# Patient Record
Sex: Male | Born: 1965 | Race: White | Hispanic: No | Marital: Married | Smoking: Current every day smoker
Health system: Southern US, Community
[De-identification: ages and names within clinical notes are randomized; demographics above are authoritative.]

## PROBLEM LIST (undated history)

## (undated) DIAGNOSIS — E7800 Pure hypercholesterolemia, unspecified: Secondary | ICD-10-CM

## (undated) DIAGNOSIS — I10 Essential (primary) hypertension: Secondary | ICD-10-CM

## (undated) DIAGNOSIS — M549 Dorsalgia, unspecified: Secondary | ICD-10-CM

## (undated) HISTORY — PX: APPENDECTOMY: SHX54

---

## 2014-12-23 ENCOUNTER — Encounter (HOSPITAL_BASED_OUTPATIENT_CLINIC_OR_DEPARTMENT_OTHER): Payer: Self-pay

## 2014-12-23 ENCOUNTER — Emergency Department (HOSPITAL_BASED_OUTPATIENT_CLINIC_OR_DEPARTMENT_OTHER): Payer: Self-pay

## 2014-12-23 ENCOUNTER — Emergency Department (HOSPITAL_BASED_OUTPATIENT_CLINIC_OR_DEPARTMENT_OTHER): Payer: No Typology Code available for payment source

## 2014-12-23 ENCOUNTER — Emergency Department (HOSPITAL_BASED_OUTPATIENT_CLINIC_OR_DEPARTMENT_OTHER)
Admission: EM | Admit: 2014-12-23 | Discharge: 2014-12-23 | Disposition: A | Discharge status: Home / Self Care | Payer: Self-pay | Attending: Emergency Medicine | Admitting: Emergency Medicine

## 2014-12-23 DIAGNOSIS — Y9389 Activity, other specified: Secondary | ICD-10-CM | POA: Insufficient documentation

## 2014-12-23 DIAGNOSIS — S40022A Contusion of left upper arm, initial encounter: Secondary | ICD-10-CM

## 2014-12-23 DIAGNOSIS — S40012A Contusion of left shoulder, initial encounter: Secondary | ICD-10-CM | POA: Insufficient documentation

## 2014-12-23 DIAGNOSIS — E780 Pure hypercholesterolemia: Secondary | ICD-10-CM | POA: Insufficient documentation

## 2014-12-23 DIAGNOSIS — Y998 Other external cause status: Secondary | ICD-10-CM | POA: Insufficient documentation

## 2014-12-23 DIAGNOSIS — I10 Essential (primary) hypertension: Secondary | ICD-10-CM | POA: Insufficient documentation

## 2014-12-23 DIAGNOSIS — Z79899 Other long term (current) drug therapy: Secondary | ICD-10-CM | POA: Insufficient documentation

## 2014-12-23 DIAGNOSIS — W208XXA Other cause of strike by thrown, projected or falling object, initial encounter: Secondary | ICD-10-CM | POA: Insufficient documentation

## 2014-12-23 DIAGNOSIS — Z720 Tobacco use: Secondary | ICD-10-CM | POA: Insufficient documentation

## 2014-12-23 DIAGNOSIS — Y9289 Other specified places as the place of occurrence of the external cause: Secondary | ICD-10-CM | POA: Insufficient documentation

## 2014-12-23 HISTORY — DX: Essential (primary) hypertension: I10

## 2014-12-23 HISTORY — DX: Dorsalgia, unspecified: M54.9

## 2014-12-23 HISTORY — DX: Pure hypercholesterolemia, unspecified: E78.00

## 2014-12-23 MED ORDER — OXYCODONE-ACETAMINOPHEN 5-325 MG PO TABS
1.0000 | ORAL_TABLET | Freq: Once | ORAL | Status: AC
  Administered 2014-12-23: 1 via ORAL
  Filled 2014-12-23: qty 1

## 2014-12-23 MED ORDER — KETOROLAC TROMETHAMINE 60 MG/2ML IM SOLN
60.0000 mg | Freq: Once | INTRAMUSCULAR | Status: AC
Start: 2014-12-23 — End: 2014-12-23
  Administered 2014-12-23: 60 mg via INTRAMUSCULAR
  Filled 2014-12-23: qty 2

## 2014-12-23 MED ORDER — OXYCODONE-ACETAMINOPHEN 5-325 MG PO TABS: 1.0000 | ORAL_TABLET | Freq: Four times a day (QID) | ORAL | Status: AC | PRN

## 2014-12-23 MED ORDER — IBUPROFEN 600 MG PO TABS: 600.0000 mg | ORAL_TABLET | Freq: Four times a day (QID) | ORAL | Status: AC | PRN

## 2014-12-23 NOTE — Discharge Instructions (Signed)
Contusion °A contusion is a deep bruise. Contusions are the result of an injury that caused bleeding under the skin. The contusion may turn blue, purple, or yellow. Minor injuries will give you a painless contusion, but more severe contusions may stay painful and swollen for a few weeks.  °CAUSES  °A contusion is usually caused by a blow, trauma, or direct force to an area of the body. °SYMPTOMS  °· Swelling and redness of the injured area. °· Bruising of the injured area. °· Tenderness and soreness of the injured area. °· Pain. °DIAGNOSIS  °The diagnosis can be made by taking a history and physical exam. An X-ray, CT scan, or MRI may be needed to determine if there were any associated injuries, such as fractures. °TREATMENT  °Specific treatment will depend on what area of the body was injured. In general, the best treatment for a contusion is resting, icing, elevating, and applying cold compresses to the injured area. Over-the-counter medicines may also be recommended for pain control. Ask your caregiver what the best treatment is for your contusion. °HOME CARE INSTRUCTIONS  °· Put ice on the injured area. °¨ Put ice in a plastic bag. °¨ Place a towel between your skin and the bag. °¨ Leave the ice on for 15-20 minutes, 3-4 times a day, or as directed by your health care provider. °· Only take over-the-counter or prescription medicines for pain, discomfort, or fever as directed by your caregiver. Your caregiver may recommend avoiding anti-inflammatory medicines (aspirin, ibuprofen, and naproxen) for 48 hours because these medicines may increase bruising. °· Rest the injured area. °· If possible, elevate the injured area to reduce swelling. °SEEK IMMEDIATE MEDICAL CARE IF:  °· You have increased bruising or swelling. °· You have pain that is getting worse. °· Your swelling or pain is not relieved with medicines. °MAKE SURE YOU:  °· Understand these instructions. °· Will watch your condition. °· Will get help right  away if you are not doing well or get worse. °Document Released: 05/18/2005 Document Revised: 08/13/2013 Document Reviewed: 06/13/2011 °ExitCare® Patient Information ©2015 [employer]. This information is not intended to replace advice given to you by your health care provider. Make sure you discuss any questions you have with your health care provider. ° °

## 2014-12-23 NOTE — ED Provider Notes (Signed)
CSN: [id number]     Arrival date & time 12/23/14  2126 History  This chart was scribed for Shon Baton, MD by Abel Presto, ED Scribe. This patient was seen in room MH12/MH12 and the patient's care was started at 11:12 PM.    Chief Complaint  Patient presents with  . Arm Injury     Patient is a 49 y.o. male presenting with arm injury. The history is provided by the patient. No language interpreter was used.  Arm Injury  HPI Comments: Cameron Lopez is a 49 y.o. male with PMHx of HTN, HLD, and back pain who presents to the Emergency Department complaining of constant left shoulder pain around 8:40 PM. Pt states he was at [employer] at onset when a 50lb bucket of joint compound fell onto his arm. Pt notes some associated numbness in arm. Patient is right-handed. Reports 10 out of 10 pain. Noted bruising to the area of impact. Pt takes Tramadol and Amlodipine daily. Pt denies weakness, head injury, and LOC.  Past Medical History  Diagnosis Date  . Hypertension   . Back pain   . High cholesterol    Past Surgical History  Procedure Laterality Date  . Appendectomy     No family history on file. History  Substance Use Topics  . Smoking status: Current Every Day Smoker  . Smokeless tobacco: Not on file  . Alcohol Use: No    Review of Systems  Musculoskeletal:       Arm pain  Skin: Positive for wound.  Neurological: Positive for numbness. Negative for weakness.  All other systems reviewed and are negative.     Allergies  Erythromycin  Home Medications   Prior to Admission medications   Medication Sig Start Date End Date Taking? Authorizing Provider  AMLODIPINE BESYLATE PO Take by mouth.   Yes Historical Provider, MD  ATORVASTATIN CALCIUM PO Take by mouth.   Yes Historical Provider, MD  Celecoxib (CELEBREX PO) Take by mouth.   Yes Historical Provider, MD  ibuprofen (ADVIL,MOTRIN) 600 MG tablet Take 1 tablet (600 mg total) by mouth every 6 (six) hours as needed. 12/23/14    Shon Baton, MD  omega-3 acid ethyl esters (LOVAZA) 1 G capsule Take by mouth 2 (two) times daily.   Yes Historical Provider, MD  oxyCODONE-acetaminophen (PERCOCET/ROXICET) 5-325 MG per tablet Take 1 tablet by mouth every 6 (six) hours as needed for severe pain. 12/23/14   Shon Baton, MD  TRAMADOL HCL PO Take by mouth.   Yes Historical Provider, MD   BP 161/100 mmHg  Pulse 78  Temp(Src) 98.3 F (36.8 C) (Oral)  Resp 18  Ht 6' (1.829 m)  Wt 165 lb (74.844 kg)  BMI 22.37 kg/m2  SpO2 95% Physical Exam  Constitutional: He is oriented to person, place, and time. He appears well-developed and well-nourished. No distress.  HENT:  Head: Normocephalic and atraumatic.  Cardiovascular: Normal rate and regular rhythm.   Pulmonary/Chest: Effort normal. No respiratory distress.  Abdominal: Soft. There is no tenderness.  Musculoskeletal:  Normal range of motion of the left shoulder and elbow, contusion noted over the mid humerus, tenderness to palpation, no obvious deformity, 2+ radial pulse, good Strength distally  Neurological: He is alert and oriented to person, place, and time.  Skin: Skin is warm and dry.  Psychiatric: He has a normal mood and affect.  Nursing note and vitals reviewed.   ED Course  Procedures (including critical care time) DIAGNOSTIC STUDIES: Oxygen Saturation is  97% on room air, normal by my interpretation.    COORDINATION OF CARE: 11:16 PM Discussed treatment plan with patient at beside, the patient agrees with the plan and has no further questions at this time.   Labs Review Labs Reviewed - No data to display  Imaging Review Dg Humerus Left  12/23/2014   CLINICAL DATA:  Struck in LEFT arm by a 5 gal bucket that fell from a shell above this head at [employer], lateral pain and bruising  EXAM: LEFT HUMERUS - 2+ VIEW  COMPARISON:  None  FINDINGS: Osseous mineralization normal.  AC joint alignment normal.  No acute fracture, dislocation or bone destruction.   IMPRESSION: Normal exam.   Electronically Signed   By: Ulyses SouthwardMark  Boles M.D.   On: 12/23/2014 21:57     EKG Interpretation None      MDM   Final diagnoses:  Arm contusion, left, initial encounter   Patient presents with right arm pain after sustaining injury. No obvious deformity. Obvious contusion. Neurovascular exam intact. Imaging negative. Patient given Toradol and will be discharged with a short course of pain medication. Discussed with patient rest, ice, compression, and supportive care at home. Patient stated understanding.  After history, exam, and medical workup I feel the patient has been appropriately medically screened and is safe for discharge home. Pertinent diagnoses were discussed with the patient. Patient was given return precautions.   I personally performed the services described in this documentation, which was scribed in my presence. The recorded information has been reviewed and is accurate.     Shon Batonourtney F Horton, MD 12/23/14 [phone]

## 2014-12-23 NOTE — ED Notes (Addendum)
Pt reports a 50 lb bucket of joint compound fell onto his left upper arm this pm-slight abrasion noted

## 2014-12-28 ENCOUNTER — Encounter (HOSPITAL_BASED_OUTPATIENT_CLINIC_OR_DEPARTMENT_OTHER): Payer: Self-pay | Admitting: *Deleted

## 2014-12-28 ENCOUNTER — Emergency Department (HOSPITAL_BASED_OUTPATIENT_CLINIC_OR_DEPARTMENT_OTHER)
Admission: EM | Admit: 2014-12-28 | Discharge: 2014-12-28 | Disposition: A | Discharge status: Home / Self Care | Payer: Self-pay | Attending: Emergency Medicine | Admitting: Emergency Medicine

## 2014-12-28 DIAGNOSIS — I10 Essential (primary) hypertension: Secondary | ICD-10-CM | POA: Insufficient documentation

## 2014-12-28 DIAGNOSIS — W208XXA Other cause of strike by thrown, projected or falling object, initial encounter: Secondary | ICD-10-CM | POA: Insufficient documentation

## 2014-12-28 DIAGNOSIS — Y990 Civilian activity done for income or pay: Secondary | ICD-10-CM | POA: Insufficient documentation

## 2014-12-28 DIAGNOSIS — Y9389 Activity, other specified: Secondary | ICD-10-CM | POA: Insufficient documentation

## 2014-12-28 DIAGNOSIS — Z720 Tobacco use: Secondary | ICD-10-CM | POA: Insufficient documentation

## 2014-12-28 DIAGNOSIS — M542 Cervicalgia: Secondary | ICD-10-CM

## 2014-12-28 DIAGNOSIS — M79602 Pain in left arm: Secondary | ICD-10-CM

## 2014-12-28 DIAGNOSIS — S4992XA Unspecified injury of left shoulder and upper arm, initial encounter: Secondary | ICD-10-CM | POA: Insufficient documentation

## 2014-12-28 DIAGNOSIS — Z8639 Personal history of other endocrine, nutritional and metabolic disease: Secondary | ICD-10-CM | POA: Insufficient documentation

## 2014-12-28 DIAGNOSIS — Z79899 Other long term (current) drug therapy: Secondary | ICD-10-CM | POA: Insufficient documentation

## 2014-12-28 DIAGNOSIS — S199XXA Unspecified injury of neck, initial encounter: Secondary | ICD-10-CM | POA: Insufficient documentation

## 2014-12-28 DIAGNOSIS — Z8739 Personal history of other diseases of the musculoskeletal system and connective tissue: Secondary | ICD-10-CM | POA: Insufficient documentation

## 2014-12-28 DIAGNOSIS — Y9289 Other specified places as the place of occurrence of the external cause: Secondary | ICD-10-CM | POA: Insufficient documentation

## 2014-12-28 NOTE — ED Provider Notes (Signed)
CSN: [id number]     Arrival date & time 12/28/14  1102 History   First MD Initiated Contact with Patient 12/28/14 1205     Chief Complaint  Patient presents with  . Arm Pain     (Consider location/radiation/quality/duration/timing/severity/associated sxs/prior Treatment) HPI  Cameron Lopez is a 49 y.o. male with PMH of HTN, dyslipidemia, back pain presenting with left arm injury 6 days ago at work where 5 gallon bucket fell on arm. Pt with negative xrays and given ibuprofen and percocet with improvement of his symptoms. Pt states the pain is getting better, worse with movement. Pt states he has developed new left sided neck pain and shoulderblade pain. No injury. No redness, swelling, numbness, tingling, fevers, chills.   Past Medical History  Diagnosis Date  . Hypertension   . Back pain   . High cholesterol   . Back pain    Past Surgical History  Procedure Laterality Date  . Appendectomy     No family history on file. History  Substance Use Topics  . Smoking status: Current Every Day Smoker    Types: Cigarettes  . Smokeless tobacco: Never Used  . Alcohol Use: No    Review of Systems See above   Allergies  Erythromycin  Home Medications   Prior to Admission medications   Medication Sig Start Date End Date Taking? Authorizing Provider  AMLODIPINE BESYLATE PO Take by mouth.   Yes Historical Provider, MD  ATORVASTATIN CALCIUM PO Take by mouth.   Yes Historical Provider, MD  Celecoxib (CELEBREX PO) Take by mouth.   Yes Historical Provider, MD  ibuprofen (ADVIL,MOTRIN) 600 MG tablet Take 1 tablet (600 mg total) by mouth every 6 (six) hours as needed. 12/23/14  Yes Shon Batonourtney F Horton, MD  omega-3 acid ethyl esters (LOVAZA) 1 G capsule Take by mouth 2 (two) times daily.   Yes Historical Provider, MD  oxyCODONE-acetaminophen (PERCOCET/ROXICET) 5-325 MG per tablet Take 1 tablet by mouth every 6 (six) hours as needed for severe pain. 12/23/14  Yes Shon Batonourtney F Horton, MD  TRAMADOL  HCL PO Take by mouth.   Yes Historical Provider, MD   BP 140/91 mmHg  Pulse 73  Temp(Src) 98.4 F (36.9 C) (Oral)  Resp 18  Ht 6' (1.829 m)  Wt 165 lb (74.844 kg)  BMI 22.37 kg/m2  SpO2 97% Physical Exam  Constitutional: He appears well-developed and well-nourished. No distress.  HENT:  Head: Normocephalic and atraumatic.  Eyes: Conjunctivae are normal. Right eye exhibits no discharge. Left eye exhibits no discharge.  Cardiovascular:  2+ radial pulses equal bilaterally  Pulmonary/Chest: Effort normal and breath sounds normal. No respiratory distress. He has no wheezes.  Left upper back tenderness to scapula without overlying skin changes, subq air or flail chest  Musculoskeletal:  FROM of left shoulder with strength intact. Ecchymoses in expected stages of healing. No signficant tenderness to palpation. Hypertrophy of left trapezius muscle. FROM of neck. Strength and sensation intact.  Neurological: He is alert. Coordination normal.  Skin: He is not diaphoretic.  Psychiatric: He has a normal mood and affect. His behavior is normal.  Nursing note and vitals reviewed.   ED Course  Procedures (including critical care time) Labs Review Labs Reviewed - No data to display  Imaging Review No results found.   EKG Interpretation None      MDM   Final diagnoses:  Neck pain on left side  Pain of left upper extremity   Pt presenting with new neck and shoulder blade  pain after arm injury. Likely from using different muscle to enable arm injury to heal. No indication for x rays. Neurovascularly intact. RICE and ibuprofen use discussed. Referral to sports medicine for persistent symptoms. Pt well appearing and stable for discharge.     Oswaldo ConroyVictoria Yanky Vanderburg, PA-C 12/28/14 1237  Benjiman CoreNathan Pickering, MD 12/28/14 1435

## 2014-12-28 NOTE — ED Notes (Signed)
Pt reports 5/3 had injury to L upper arm while at work, 5 gallon bucket fell onto arm, had xray which was negative, given ibuprofen and percocet and states he only took a few times.  Reports pain improved only minimally, worse with extension of arm laterally, full sensation and rom.  Pain radiating to trapezius and l shoulderblade area.  No other injuries.

## 2014-12-28 NOTE — ED Notes (Signed)
PA at bedside.

## 2014-12-28 NOTE — Discharge Instructions (Signed)
Return to the emergency room with worsening of symptoms, new symptoms or with symptoms that are concerning, especially numbness, tingling, redness, swelling, unable to move arm. RICE: Rest, Ice (three cycles of 20 mins on, 20mins off at least twice a day), compression/brace, elevation. Heating pad works well for neck pain. Ibuprofen 400mg  (2 tablets 200mg ) every 5-6 hours for 3-5 days. Follow up with PCP/orthopedist if symptoms worsen or are persistent. Read below information and follow recommendations.  Cervical Sprain A cervical sprain is an injury in the neck in which the strong, fibrous tissues (ligaments) that connect your neck bones stretch or tear. Cervical sprains can range from mild to severe. Severe cervical sprains can cause the neck vertebrae to be unstable. This can lead to damage of the spinal cord and can result in serious nervous system problems. The amount of time it takes for a cervical sprain to get better depends on the cause and extent of the injury. Most cervical sprains heal in 1 to 3 weeks. CAUSES  Severe cervical sprains may be caused by:   Contact sport injuries (such as from football, rugby, wrestling, hockey, auto racing, gymnastics, diving, martial arts, or boxing).   Motor vehicle collisions.   Whiplash injuries. This is an injury from a sudden forward and backward whipping movement of the head and neck.  Falls.  Mild cervical sprains may be caused by:   Being in an awkward position, such as while cradling a telephone between your ear and shoulder.   Sitting in a chair that does not offer proper support.   Working at a poorly Marketing executivedesigned computer station.   Looking up or down for long periods of time.  SYMPTOMS   Pain, soreness, stiffness, or a burning sensation in the front, back, or sides of the neck. This discomfort may develop immediately after the injury or slowly, 24 hours or more after the injury.   Pain or tenderness directly in the middle of  the back of the neck.   Shoulder or upper back pain.   Limited ability to move the neck.   Headache.   Dizziness.   Weakness, numbness, or tingling in the hands or arms.   Muscle spasms.   Difficulty swallowing or chewing.   Tenderness and swelling of the neck.  DIAGNOSIS  Most of the time your health care provider can diagnose a cervical sprain by taking your history and doing a physical exam. Your health care provider will ask about previous neck injuries and any known neck problems, such as arthritis in the neck. X-rays may be taken to find out if there are any other problems, such as with the bones of the neck. Other tests, such as a CT scan or MRI, may also be needed.  TREATMENT  Treatment depends on the severity of the cervical sprain. Mild sprains can be treated with rest, keeping the neck in place (immobilization), and pain medicines. Severe cervical sprains are immediately immobilized. Further treatment is done to help with pain, muscle spasms, and other symptoms and may include:  Medicines, such as pain relievers, numbing medicines, or muscle relaxants.   Physical therapy. This may involve stretching exercises, strengthening exercises, and posture training. Exercises and improved posture can help stabilize the neck, strengthen muscles, and help stop symptoms from returning.  HOME CARE INSTRUCTIONS   Put ice on the injured area.   Put ice in a plastic bag.   Place a towel between your skin and the bag.   Leave the ice on for  15-20 minutes, 3-4 times a day.   If your injury was severe, you may have been given a cervical collar to wear. A cervical collar is a two-piece collar designed to keep your neck from moving while it heals.  Do not remove the collar unless instructed by your health care provider.  If you have long hair, keep it outside of the collar.  Ask your health care provider before making any adjustments to your collar. Minor adjustments may  be required over time to improve comfort and reduce pressure on your chin or on the back of your head.  Ifyou are allowed to remove the collar for cleaning or bathing, follow your health care provider's instructions on how to do so safely.  Keep your collar clean by wiping it with mild soap and water and drying it completely. If the collar you have been given includes removable pads, remove them every 1-2 days and hand wash them with soap and water. Allow them to air dry. They should be completely dry before you wear them in the collar.  If you are allowed to remove the collar for cleaning and bathing, wash and dry the skin of your neck. Check your skin for irritation or sores. If you see any, tell your health care provider.  Do not drive while wearing the collar.   Only take over-the-counter or prescription medicines for pain, discomfort, or fever as directed by your health care provider.   Keep all follow-up appointments as directed by your health care provider.   Keep all physical therapy appointments as directed by your health care provider.   Make any needed adjustments to your workstation to promote good posture.   Avoid positions and activities that make your symptoms worse.   Warm up and stretch before being active to help prevent problems.  SEEK MEDICAL CARE IF:   Your pain is not controlled with medicine.   You are unable to decrease your pain medicine over time as planned.   Your activity level is not improving as expected.  SEEK IMMEDIATE MEDICAL CARE IF:   You develop any bleeding.  You develop stomach upset.  You have signs of an allergic reaction to your medicine.   Your symptoms get worse.   You develop new, unexplained symptoms.   You have numbness, tingling, weakness, or paralysis in any part of your body.  MAKE SURE YOU:   Understand these instructions.  Will watch your condition.  Will get help right away if you are not doing well or  get worse. Document Released: 06/05/2007 Document Revised: 08/13/2013 Document Reviewed: 02/13/2013 [facility] Patient Information 2015 [employer], [place]. This information is not intended to replace advice given to you by your health care provider. Make sure you discuss any questions you have with your health care provider.

## 2014-12-28 NOTE — ED Notes (Signed)
Recheck left arm s/p arm injury at [employer] last Tuesday- states still having pain

## 2014-12-31 ENCOUNTER — Ambulatory Visit (INDEPENDENT_AMBULATORY_CARE_PROVIDER_SITE_OTHER): Payer: Self-pay | Admitting: Family Medicine

## 2014-12-31 ENCOUNTER — Encounter: Payer: Self-pay | Admitting: Family Medicine

## 2014-12-31 VITALS — BP 152/95 | HR 75 | Ht 72.0 in | Wt 165.0 lb

## 2014-12-31 DIAGNOSIS — S4992XA Unspecified injury of left shoulder and upper arm, initial encounter: Secondary | ICD-10-CM

## 2014-12-31 NOTE — Patient Instructions (Signed)
You have a biceps contusion with nerve irritation of your radial nerve at the upper arm. I would expect these to completely resolve over the next 2-4 weeks. Follow up with me in that time frame. Start home range of motion exercises (arm circles, pendulums, wall walking or table slides) - 3 sets of 10 once or twice a day. Call me if you want to do physical therapy. I wouldn't do ice or heat actually for this - ice can slow nerve issues from recovering, heat may make your pain worse. Tylenol, ibuprofen only as needed for pain.

## 2015-01-06 DIAGNOSIS — S4992XA Unspecified injury of left shoulder and upper arm, initial encounter: Secondary | ICD-10-CM | POA: Insufficient documentation

## 2015-01-06 NOTE — Assessment & Plan Note (Signed)
2/2 direct blow from 5 gallon pail of joint compounds.  Consistent with biceps muscle contusion - swelling likely to be causing local nerve irritation of the radial nerve in the humerus leading to numbness into digits.  Start home exercises for the shoulder (codman).  Tylenol, ibuprofen if needed.  Call us if he wants to do PT.  F/u in 2-4 weeks otherwise - expect this to completely resolve in that time frame.

## 2015-01-06 NOTE — Progress Notes (Signed)
PCP: No primary care provider on file.  Subjective:   HPI: Patient is a 49 y.o. male here for left arm injury.  [facility] a 5 gallon pail of joint compound feel off a high shelf and landed on his left upper arm. Immediate pain lateral left upper arm. Could move fully initially though next day could only abduct to about 45 degrees - has improved to about 100. Yesterday developed pain near elbow with tingling into all fingers of left hand. No prior issues with this shoulder or arm. Is right handed. Radiographs of humerus negative.  Past Medical History  Diagnosis Date  . Hypertension   . Back pain   . High cholesterol   . Back pain     Current Outpatient Prescriptions on File Prior to Visit  Medication Sig Dispense Refill  . AMLODIPINE BESYLATE PO Take by mouth.    . ATORVASTATIN CALCIUM PO Take by mouth.    . Celecoxib (CELEBREX PO) Take by mouth.    Marland Kitchen. ibuprofen (ADVIL,MOTRIN) 600 MG tablet Take 1 tablet (600 mg total) by mouth every 6 (six) hours as needed. 30 tablet 0  . omega-3 acid ethyl esters (LOVAZA) 1 G capsule Take by mouth 2 (two) times daily.    Marland Kitchen. oxyCODONE-acetaminophen (PERCOCET/ROXICET) 5-325 MG per tablet Take 1 tablet by mouth every 6 (six) hours as needed for severe pain. 10 tablet 0  . TRAMADOL HCL PO Take by mouth.     No current facility-administered medications on file prior to visit.    Past Surgical History  Procedure Laterality Date  . Appendectomy      Allergies  Allergen Reactions  . Erythromycin Itching    History   Social History  . Marital Status: Married    Spouse Name: N/A  . Number of Children: N/A  . Years of Education: N/A   Occupational History  . Not on file.   Social History Main Topics  . Smoking status: Current Every Day Smoker -- 1.00 packs/day    Types: Cigarettes  . Smokeless tobacco: Never Used  . Alcohol Use: No  . Drug Use: No  . Sexual Activity: Not on file   Other Topics  Concern  . Not on file   Social History Narrative    No family history on file.  BP 152/95 mmHg  Pulse 75  Ht 6' (1.829 m)  Wt 165 lb (74.844 kg)  BMI 22.37 kg/m2  Review of Systems: See HPI above.    Objective:  Physical Exam:  Gen: NAD  Left shoulder: No swelling, ecchymoses.  No gross deformity. No TTP. FROM passively but pain on abduction beyond 100 degrees actively. Negative Hawkins, Neers. Mild pain with speeds, yergasons, elbow flexion. Strength 5/5 with empty can and resisted internal/external rotation. Negative apprehension. Sensation slightly diminished all digits dorsally compared to right hand.    Assessment & Plan:  1. Left arm injury - 2/2 direct blow from 5 gallon pail of joint compounds.  Consistent with biceps muscle contusion - swelling likely to be causing local nerve irritation of the radial nerve in the humerus leading to numbness into digits.  Start home exercises for the shoulder (codman).  Tylenol, ibuprofen if needed.  Call us if he wants to do PT.  F/u in 2-4 weeks otherwise - expect this to completely resolve in that time frame.

## 2015-01-15 ENCOUNTER — Encounter: Payer: Self-pay | Admitting: Family Medicine

## 2015-01-15 ENCOUNTER — Ambulatory Visit (INDEPENDENT_AMBULATORY_CARE_PROVIDER_SITE_OTHER): Payer: Self-pay | Admitting: Family Medicine

## 2015-01-15 VITALS — BP 135/90 | HR 76 | Ht 72.0 in | Wt 165.0 lb

## 2015-01-15 DIAGNOSIS — S4992XD Unspecified injury of left shoulder and upper arm, subsequent encounter: Secondary | ICD-10-CM

## 2015-01-16 NOTE — Assessment & Plan Note (Signed)
2/2 direct blow from 5 gallon pail of joint compounds.  Consistent with biceps muscle contusion which has almost completely resolved.  Still with some nerve irritation - discussed could take weeks for this to completely recover.  No motor findings now however.  F/u in about 4-6 weeks for reevaluation.

## 2015-01-16 NOTE — Progress Notes (Signed)
PCP: No primary care provider on file.  Subjective:   HPI: Patient is a 49 y.o. male here for left arm injury.  5/11: [facility] when a 5 gallon pail of joint compound feel off a high shelf and landed on his left upper arm. Immediate pain lateral left upper arm. Could move fully initially though next day could only abduct to about 45 degrees - has improved to about 100. Yesterday developed pain near elbow with tingling into all fingers of left hand. No prior issues with this shoulder or arm. Is right handed. Radiographs of humerus negative.  5/26: Patient reports he is much better. Pain is gone. Still has numbness in index and middle fingers of left hand. Motion better of shoulder.  Past Medical History  Diagnosis Date  . Hypertension   . Back pain   . High cholesterol   . Back pain     Current Outpatient Prescriptions on File Prior to Visit  Medication Sig Dispense Refill  . AMLODIPINE BESYLATE PO Take by mouth.    . ATORVASTATIN CALCIUM PO Take by mouth.    . Celecoxib (CELEBREX PO) Take by mouth.    . fluticasone (FLONASE) 50 MCG/ACT nasal spray   11  . ibuprofen (ADVIL,MOTRIN) 600 MG tablet Take 1 tablet (600 mg total) by mouth every 6 (six) hours as needed. 30 tablet 0  . omega-3 acid ethyl esters (LOVAZA) 1 G capsule Take by mouth 2 (two) times daily.    Marland Kitchen. oxyCODONE-acetaminophen (PERCOCET/ROXICET) 5-325 MG per tablet Take 1 tablet by mouth every 6 (six) hours as needed for severe pain. 10 tablet 0  . TRAMADOL HCL PO Take by mouth.     No current facility-administered medications on file prior to visit.    Past Surgical History  Procedure Laterality Date  . Appendectomy      Allergies  Allergen Reactions  . Erythromycin Itching    History   Social History  . Marital Status: Married    Spouse Name: N/A  . Number of Children: N/A  . Years of Education: N/A   Occupational History  . Not on file.   Social History Main  Topics  . Smoking status: Current Every Day Smoker -- 1.00 packs/day    Types: Cigarettes  . Smokeless tobacco: Never Used  . Alcohol Use: No  . Drug Use: No  . Sexual Activity: Not on file   Other Topics Concern  . Not on file   Social History Narrative    No family history on file.  BP 135/90 mmHg  Pulse 76  Ht 6' (1.829 m)  Wt 165 lb (74.844 kg)  BMI 22.37 kg/m2  Review of Systems: See HPI above.    Objective:  Physical Exam:  Gen: NAD  Left shoulder: No swelling, ecchymoses.  No gross deformity. No TTP. FROM with minimal painful arc. Negative Hawkins, Neers. Negative yergasons.  No pain with elbow motions. Strength 5/5 with empty can and resisted internal/external rotation. Sensation diminished 2nd and 3rd digits from middle phalanx distally.    Assessment & Plan:  1. Left arm injury - 2/2 direct blow from 5 gallon pail of joint compounds.  Consistent with biceps muscle contusion which has almost completely resolved.  Still with some nerve irritation - discussed could take weeks for this to completely recover.  No motor findings now however.  F/u in about 4-6 weeks for reevaluation.

## 2015-02-11 ENCOUNTER — Ambulatory Visit (INDEPENDENT_AMBULATORY_CARE_PROVIDER_SITE_OTHER): Payer: Self-pay | Admitting: Family Medicine

## 2015-02-11 ENCOUNTER — Encounter: Payer: Self-pay | Admitting: Family Medicine

## 2015-02-11 VITALS — BP 137/79 | HR 79 | Ht 72.0 in | Wt 165.0 lb

## 2015-02-11 DIAGNOSIS — S4992XD Unspecified injury of left shoulder and upper arm, subsequent encounter: Secondary | ICD-10-CM

## 2015-02-12 NOTE — Assessment & Plan Note (Signed)
2/2 direct blow from 5 gallon pail of joint compounds.  Consistent with biceps muscle contusion, nerve irritation - all has resolved.  F/u prn.

## 2015-02-12 NOTE — Progress Notes (Signed)
PCP: No primary care provider on file.  Subjective:   HPI: Patient is a 49 y.o. male here for left arm injury.  5/11: Patient reports he was at [facility] on 5/3 when a 5 gallon pail of joint compound feel off a high shelf and landed on his left upper arm. Immediate pain lateral left upper arm. Could move fully initially though next day could only abduct to about 45 degrees - has improved to about 100. Yesterday developed pain near elbow with tingling into all fingers of left hand. No prior issues with this shoulder or arm. Is right handed. Radiographs of humerus negative.  5/26: Patient reports he is much better. Pain is gone. Still has numbness in index and middle fingers of left hand. Motion better of shoulder.  6/22: Patient reports he feels completely better at this point. No pain. Tingling/numbness in middle and index fingers resolved about a week ago.  Past Medical History  Diagnosis Date  . Hypertension   . Back pain   . High cholesterol   . Back pain     Current Outpatient Prescriptions on File Prior to Visit  Medication Sig Dispense Refill  . fluticasone (FLONASE) 50 MCG/ACT nasal spray   11  . ibuprofen (ADVIL,MOTRIN) 600 MG tablet Take 1 tablet (600 mg total) by mouth every 6 (six) hours as needed. 30 tablet 0  . omega-3 acid ethyl esters (LOVAZA) 1 G capsule Take by mouth 2 (two) times daily.    Marland Kitchen oxyCODONE-acetaminophen (PERCOCET/ROXICET) 5-325 MG per tablet Take 1 tablet by mouth every 6 (six) hours as needed for severe pain. 10 tablet 0  . TRAMADOL HCL PO Take by mouth.     No current facility-administered medications on file prior to visit.    Past Surgical History  Procedure Laterality Date  . Appendectomy      Allergies  Allergen Reactions  . Erythromycin Itching    History   Social History  . Marital Status: Married    Spouse Name: N/A  . Number of Children: N/A  . Years of Education: N/A   Occupational History  . Not on file.    Social History Main Topics  . Smoking status: Current Every Day Smoker -- 1.00 packs/day    Types: Cigarettes  . Smokeless tobacco: Never Used  . Alcohol Use: No  . Drug Use: No  . Sexual Activity: Not on file   Other Topics Concern  . Not on file   Social History Narrative    No family history on file.  BP 137/79 mmHg  Pulse 79  Ht 6' (1.829 m)  Wt 165 lb (74.844 kg)  BMI 22.37 kg/m2  Review of Systems: See HPI above.    Objective:  Physical Exam:  Gen: NAD  Left shoulder: No swelling, ecchymoses.  No gross deformity. No TTP. FROM without painful arc. Negative Hawkins, Neers. Negative yergasons. Strength 5/5 with empty can and resisted internal/external rotation. Sensation intact to light touch in digits.    Assessment & Plan:  1. Left arm injury - 2/2 direct blow from 5 gallon pail of joint compounds.  Consistent with biceps muscle contusion, nerve irritation - all has resolved.  F/u prn.

## 2015-10-19 IMAGING — DX DG HUMERUS 2V *L*
2 series · 2 of 2 positions shown · non-contrast
Comparison: None

CLINICAL DATA: Struck in LEFT arm by a 5 gal bucket that fell from
a shell above this head at [employer], lateral pain and bruising

EXAM:
LEFT HUMERUS - 2+ VIEW

[humerus ap]
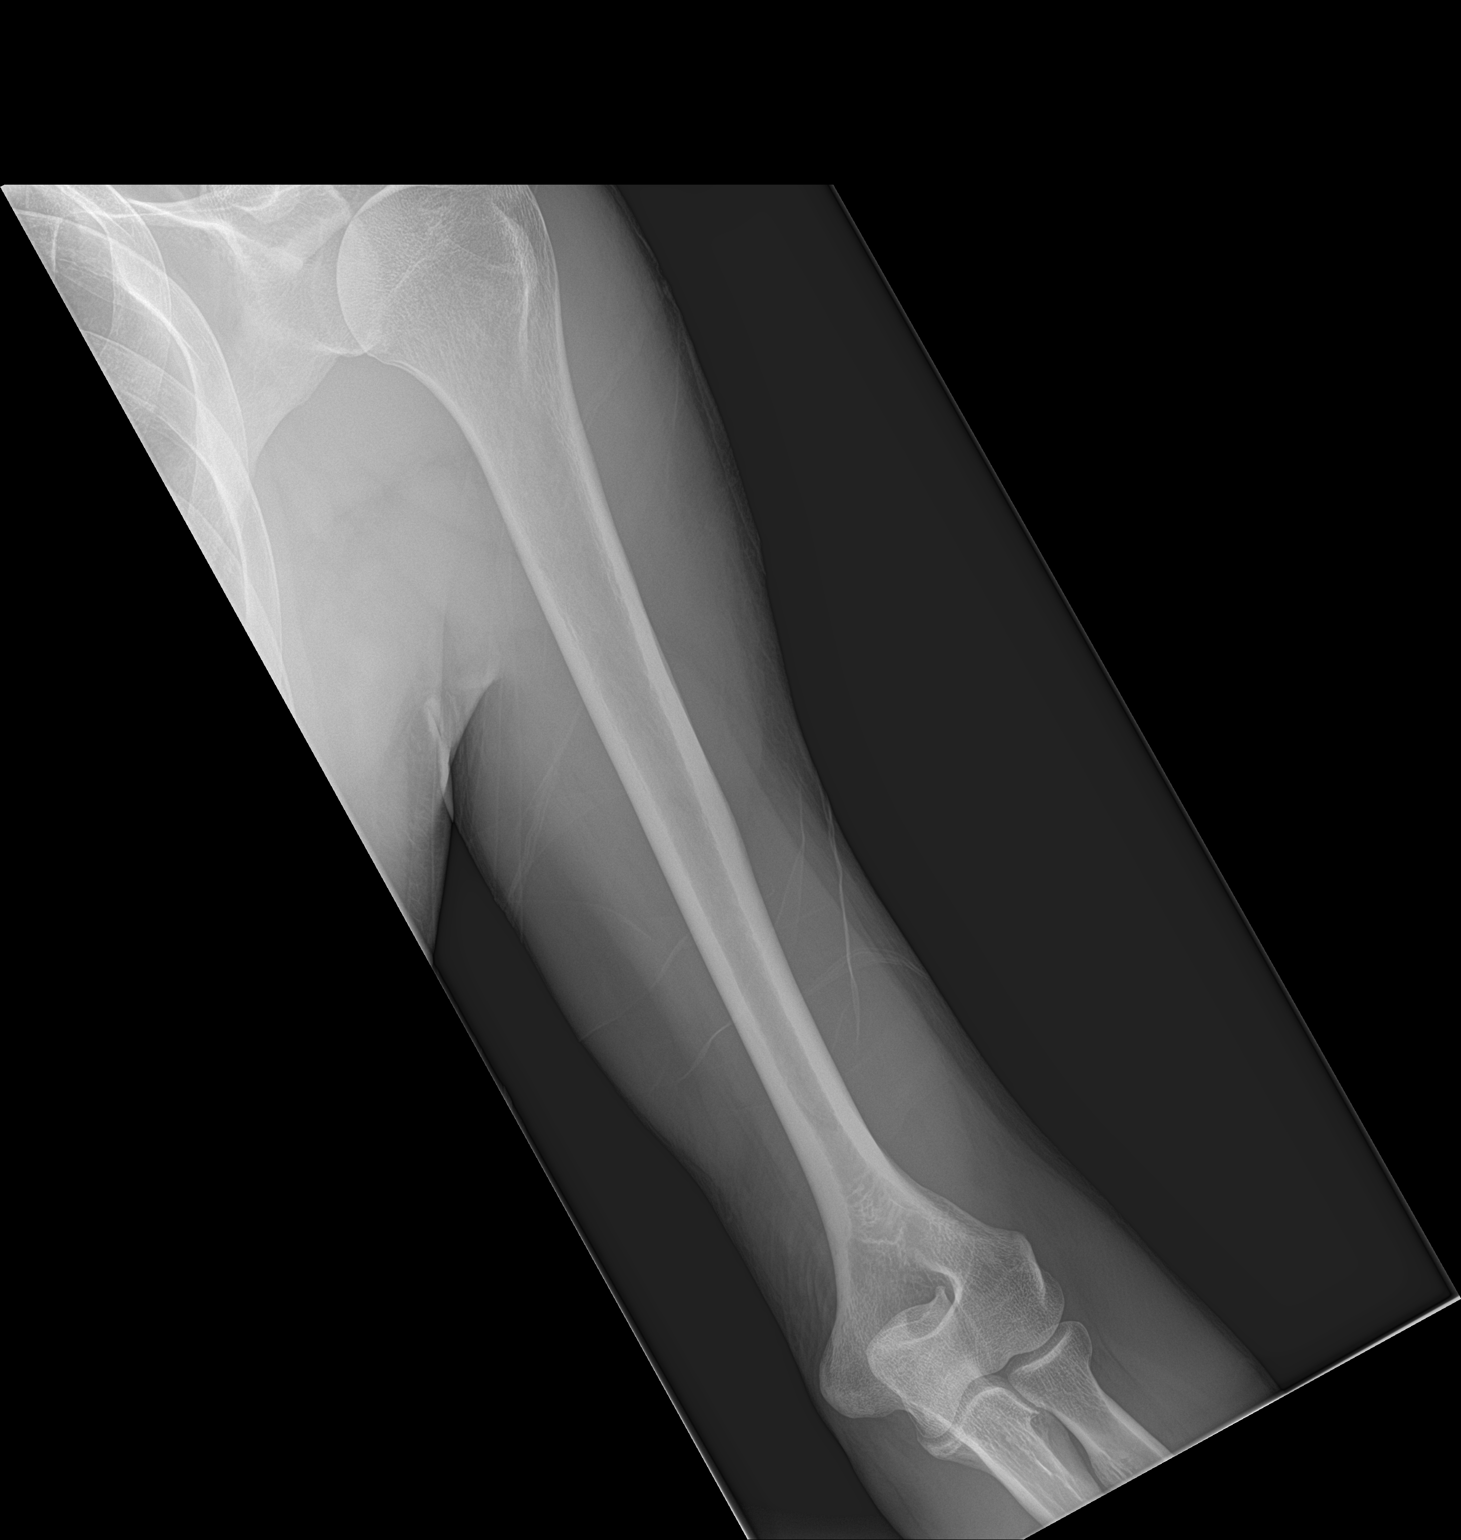

[humerus lat]
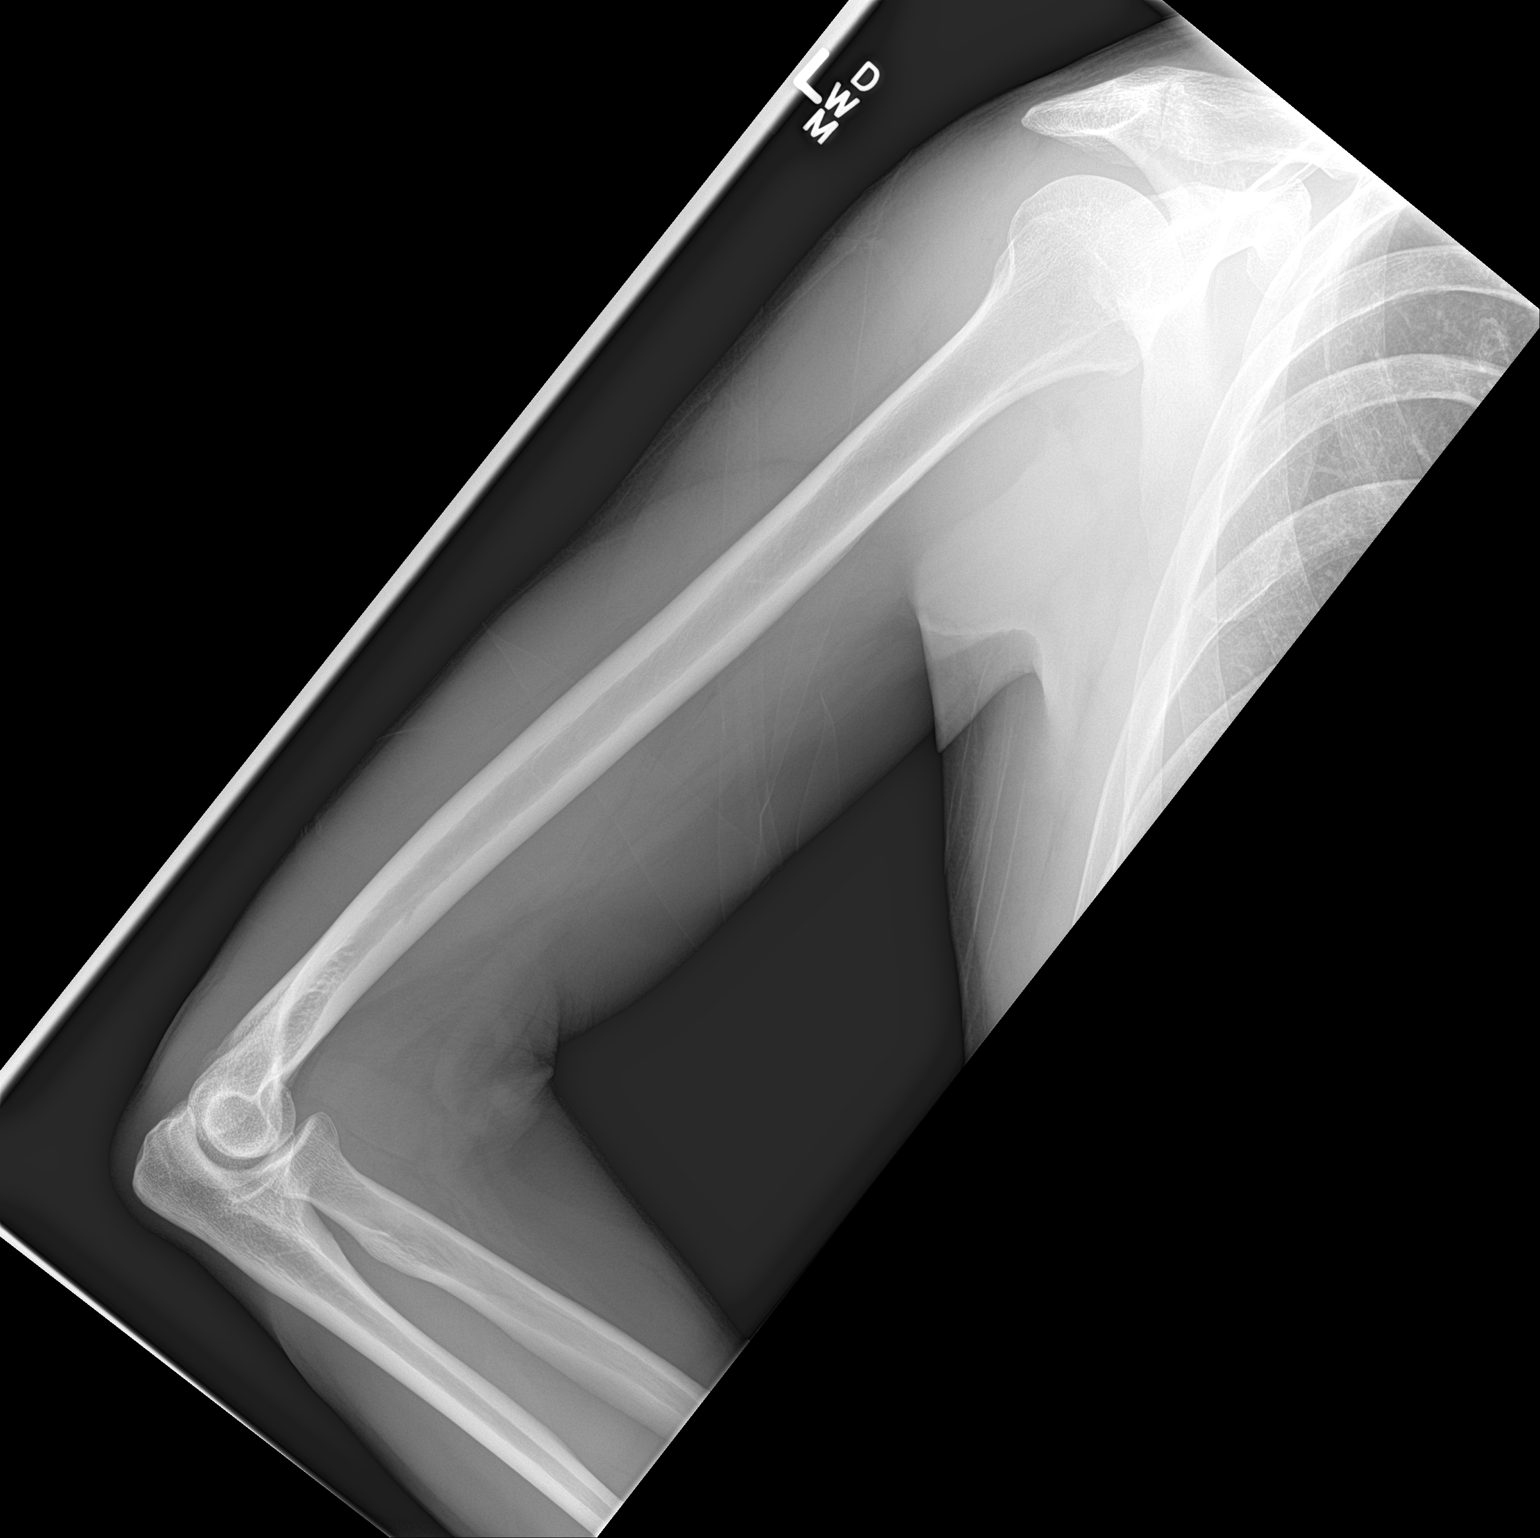

[2 of 2 positions shown; findings below may reference images not displayed]

FINDINGS: Osseous mineralization normal.

AC joint alignment normal.

No acute fracture, dislocation or bone destruction.
IMPRESSION: Normal exam.
